# Patient Record
Sex: Male | Born: 1995 | Race: White | Hispanic: No | Marital: Single | Smoking: Never smoker
Health system: Southern US, Community
[De-identification: ages and names within clinical notes are randomized; demographics above are authoritative.]

---
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
## 2016-01-26 ENCOUNTER — Ambulatory Visit (INDEPENDENT_AMBULATORY_CARE_PROVIDER_SITE_OTHER): Payer: Managed Care, Other (non HMO) | Admitting: Pediatrics

## 2016-01-26 ENCOUNTER — Encounter: Payer: Self-pay | Admitting: Pediatrics

## 2016-01-26 VITALS — BP 118/62 | Ht 70.5 in | Wt 158.2 lb

## 2016-01-26 DIAGNOSIS — F341 Dysthymic disorder: Secondary | ICD-10-CM | POA: Insufficient documentation

## 2016-01-26 DIAGNOSIS — F902 Attention-deficit hyperactivity disorder, combined type: Secondary | ICD-10-CM | POA: Insufficient documentation

## 2016-01-26 DIAGNOSIS — F4320 Adjustment disorder, unspecified: Secondary | ICD-10-CM | POA: Diagnosis not present

## 2016-01-26 DIAGNOSIS — F900 Attention-deficit hyperactivity disorder, predominantly inattentive type: Secondary | ICD-10-CM | POA: Insufficient documentation

## 2016-01-26 MED ORDER — METHYLPHENIDATE HCL ER (OSM) 27 MG PO TBCR: 27.0000 mg | EXTENDED_RELEASE_TABLET | Freq: Every morning | ORAL | 0 refills | Status: AC

## 2016-01-26 NOTE — Progress Notes (Addendum)
[place] [facility] [place] [facility] [address], [place] [place] [address] Dept: [phone] Dept Fax: [phone] Loc: [phone] Loc Fax: [phone]  Medical Follow-up  Johnny Brady ID: Johnny Brady, male  DOB: 08-01-1995, 20 y.o.  MRN: [id number]  Date of Evaluation: 01/26/2016  PCP: Cam Hai, CNM  Accompanied by: Mother Johnny Brady Lives with: Parents, 75 year old brother, and 45 year old brother.  HISTORY/CURRENT STATUS:  HPI Medical follow-up for a 20 year old Johnny Brady who was previously evaluated and treated at [facility] for ADHD and an adolescent adjustment reaction. Johnny Brady was last seen on 08/11/2013 at this Center.  EDUCATION: School: Out of school    Activities/Exercise: Johnny Brady does not get much exercise except for occasional walking outside or playing with his 67 year old dog. Johnny Brady is looking for a job through [employer] and, according to his mother, Johnny Brady plays a lot of video games. Johnny Brady likes to draw and reported that Johnny Brady has been doing this on a fairly regular basis. Johnny Brady also taught himself to play the piano and occasionally does this.  MEDICAL HISTORY: Appetite: Good MVI/Other: No Fruits/Vegs: Daily Calcium: Daily Iron: Likes meats, not eggs.  Sleep: Bedtime: 12 midnight or later. Can stay up until 4 or 5 AM if Johnny Brady is doing something. Awakens: 10 AM no matter when Johnny Brady goes to bed. Sleep Concerns: Initiation/Maintenance/Other: None  Individual Medical History/Review of System Changes? No. Johnny Brady has been healthy without significant complaints.  Allergies: Review of Johnny Brady's allergies indicates no known allergies.  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 Johnny Brady has not seen the prescribing physician since Johnny Brady came  home from college in May 2017.  Medication Side Effects: None reported  Family Medical/Social History Changes?: Yes. His older brother who is now 44 years old still lives at home but is working part-time at [employer] and has a girlfriend. This brother has been diagnosed with ADHD in the past but is not being treated at the present time. There also is a 19 year old younger brother who is a Holiday representative in high school and has a diagnosis of ADHD and an autistic spectrum disorder. Johnny Brady has an IEP and is in mainstream classes with inclusion. Johnny Brady also has been monitored by a child psychiatrist and is being treated with Concerta, Abilify, Remeron, and a small dose of short acting methylphenidate. Johnny Brady's 18 year old sister was married in August 2017 and is now in graduate school at [employer] in [place], [place]. Since she has moved out of the house, Johnny Brady now has his own room and no longer has to share a room with his younger brother. Johnny Brady's father is employed full time but does not travel any longer, so Johnny Brady is home on a regular basis. Johnny Brady and the Johnny Brady "butt heads" a lot. Johnny Brady also reports that both of his brothers "get on my nerves a lot". Johnny Brady's mother is a full-time homemaker, and she reported that she is being treated with antidepressant medication and this has helped her a lot with her overall mental health. Johnny Brady's paternal grandparents are both being treated with SSRI's, and his maternal grandfather died of renal cancer last year. Johnny Brady's mother apparently was his primary caretaker, and the maternal grandmother also still lives in [place] area.  MENTAL HEALTH: Mental Health Issues: Johnny Brady was diagnosed with depression last school year and is  being treated with sertraline 100 mg every morning a.m. Johnny Brady has a male friend from high school, and Johnny Brady apparently signed a  lease on an apartment in [place] with her but only stayed for 2 days. Johnny Brady reported that there was never any romantic  relationship with her, and Johnny Brady, in fact, does not like her very much anymore. Johnny Brady also has some debt in college, and his mother reported that this could be a problem if Johnny Brady does not return to school next semester. Johnny Brady is on academic probation and is considering transfer to [employer] although Johnny Brady does not want to start back in college until the fall of 2018.  PHYSICAL EXAM: Vitals:  Today's Vitals   01/26/16 1231  BP: 118/62  Weight: 158 lb 3.2 oz (71.8 kg)  Height: 5' 10.5" (1.791 m)  , 48 %ile (Z= -0.05) based on CDC 2-20 Years BMI-for-age data using vitals from 01/26/2016. Body mass index is 22.38 kg/m.  General Exam: Physical Exam  Constitutional: Johnny Brady appears well-developed and well-nourished.  HENT:  Head: Normocephalic.  Right Ear: External ear normal.  Left Ear: External ear normal.  Nose: Nose normal.  Mouth/Throat: Oropharynx is clear and moist.  The left tympanic membrane could not be visualized because of cerumen occluding the external auditory canal. There was also some cerumen present in the right external auditory canal, but the right tympanic membrane could be visualized and looked normal.  Eyes: EOM are normal. Pupils are equal, round, and reactive to light.  Neck: Normal range of motion. Neck supple. No thyromegaly present.  Cardiovascular: Normal rate, regular rhythm and normal heart sounds.   Pulmonary/Chest: Effort normal and breath sounds normal.  Abdominal: Soft. There is no tenderness.  No masses or hepatosplenomegaly appreciated.  Genitourinary:  Genitourinary Comments: Deferred  Musculoskeletal: Normal range of motion.  Lymphadenopathy:    Johnny Brady has no cervical adenopathy.   Neurological exam: Oriented to person, place, time and situation.  Cranial Nerves: ll-XII intact including normal vision (by report), ability to move eyes in all directions and close eyes, a symmetrical smile, normal hearing (by report), and ability to swallow, elevate shoulders, and  protrude and lateralize tongue.  Neuromuscular:  Motor Mass: normal     Tone: normal     Strength: normal  DTR's: 3+ and symmetrical for both upper and lower extremities, no ankle clonus noted, and plantar responses flexor bilaterally.  Cerebellar: Normal gait. No ataxia, nystagmus, or tremor noted. Finger-to-finger and finger-to-nose maneuvers done appropriately without overflow movements(synkinesis), rapid alternating movements done well.  Sensory: Fine touch grossly intact without tactile defensiveness.  Gross motor skills: Able to walk on heels and toes, perform a tandem gait both forward and reversed, jump, hop on each foot alone without difficulty.  Testing/Developmental Screens: ASRS: Part A-21, Part B- 14  DIAGNOSES:    ICD-9-CM ICD-10-CM   1. ADHD (attention deficit hyperactivity disorder), combined type 314.01 F90.2 methylphenidate (CONCERTA) 27 MG PO CR tablet  2. Dysthymia 300.4 F34.1 sertraline (ZOLOFT) 100 MG tablet  3. Adjustment reaction of adolescence 309.9 F43.20     RECOMMENDATIONS:  I told Johnny Brady that I will continue to prescribe sertraline 100 mg every morning since Johnny Brady is doing well on this and does not have any significant side effects, as long as Johnny Brady is seeing a counselor on a regular basis. I gave him the alternative of being referred to a psychiatrist, but Johnny Brady elected to continue to come to [facility].  Even though Johnny Brady is not in school to present  time, I am restarting Concerta 27 mg every morning since Johnny Brady is not sure this is effective while his mother thinks it helps him a lot with focus. Also, Johnny Brady is interested in getting a driver's license, and I want to make sure that Johnny Brady is being treated if Johnny Brady is driving.  I plan on seeing Johnny Brady in about one month to determine if Johnny Brady is seeing a counselor, and what his his response to Concerta been. Also, I would like to see if Johnny Brady is better organized and has more motivation on medication. His plan is to continue to look for a job with  the hope of returning to college in the fall of 2018. Johnny Brady states that Johnny Brady ultimately would like to be a psychologist and do counseling.  Johnny Brady Instructions  Restart Concerta 27 mg every morning for after breakfast.  Continue sertraline 100 mg every morning as previously prescribed.  Make an appointment with the psychologist for counseling. Resources include [facility] (Dr. Bryson DamesSteven Altabet or Dr. Caralyn Guileavid Gutterman: [phone]), [facility], [facility], [facility], [facility] or [facility]. Call me if you need more references  Follow-up with primary care physician for health maintenance according to her schedule.   NEXT APPOINTMENT: Return in about 4 weeks (around 02/23/2016).   Greater than 50 percent of the time spent in counseling, discussing diagnosis and management of symptoms with Johnny Brady and family.   Roda Shuttershomas H. Quina Wilbourne, MD Counseling Time: 90 minutes      Total Contact Time: 120 minutes

## 2016-01-26 NOTE — Patient Instructions (Addendum)
Restart Concerta 27 mg every morning for after breakfast.  Continue sertraline 100 mg every morning as previously prescribed.  Make an appointment with the psychologist for counseling. Resources include [facility] (Dr. Bryson DamesSteven Altabet or Dr. Caralyn Guileavid Gutterman: [phone]), [facility], [facility], [facility], [facility] or [facility]. Call me if you need more references  Follow-up with primary care physician for health maintenance according to her schedule.

## 2016-02-14 ENCOUNTER — Encounter: Payer: Self-pay | Admitting: Pediatrics

## 2016-02-14 ENCOUNTER — Ambulatory Visit (INDEPENDENT_AMBULATORY_CARE_PROVIDER_SITE_OTHER): Payer: Managed Care, Other (non HMO) | Admitting: Pediatrics

## 2016-02-14 VITALS — Wt 158.8 lb

## 2016-02-14 DIAGNOSIS — F4320 Adjustment disorder, unspecified: Secondary | ICD-10-CM | POA: Diagnosis not present

## 2016-02-14 DIAGNOSIS — F902 Attention-deficit hyperactivity disorder, combined type: Secondary | ICD-10-CM

## 2016-02-14 DIAGNOSIS — F341 Dysthymic disorder: Secondary | ICD-10-CM | POA: Diagnosis not present

## 2016-02-14 NOTE — Patient Instructions (Signed)
Keep report with Dr. Sheppard CoilAltebet on 02/20/2016  Continue sertraline 100 mg every a.m. If you need to have a refill sent to pharmacy, I will do this.  Please make a plan of action for the next several months. I would recommend that he start driving on a regular basis, that you start reading or doing something that would enhance your performance in college, and video games probably won't do this. Please send me a copy of your plan once it is finished. Can mail, fax it, scant and send by email, or drop it off. We can then determine whether we need to continue Concerta or not at that time.  I recommend that you call the manager at [employer] and hopefully will have an interview for a job there.  When you have completed your plan and have seen Dr. Sheppard CoilAltebet, please contact me so that we can determine whether we should continue and/or increase the Concerta. Is the 27 mg has not been particularly effective and there are no significant side effects reported, I would recommend that we increase the dose to 36 mg every morning if we continue this.  I recommend that you make an appointment with your primary care doctor since it has been over a year and used to do have some chronic complaints that need to be addressed. It also is important to determine if you need any immunizations to stay up-to-date. I would recommend that you get a flu shot but that is up to you and your primary care doctor.  If you want to continue having your medications prescribed through this Center, we need to see you at least every 3 months.

## 2016-02-14 NOTE — Progress Notes (Addendum)
[place]  [facility] [place] [facility] [address] [place]. [address] [place] [address] Dept: [phone] Dept Fax: [phone] Loc: [phone] Loc Fax: [phone]  Medication Check  Patient ID: Johnny Brady, male  DOB: 1996/03/03, 20 y.o.  MRN: [id number]  Date of Evaluation: 02/14/2016  PCP: Cam Hai, CNM  Accompanied by: Mother Patient Lives with: Parents and 2 brothers, ages 96 years and 17 years respectively.  HISTORY/CURRENT STATUS: HPI medication check to determine patient's response to Concerta 27 mg and to determine what plans he has made regarding his future.  EDUCATION: School: He has completed 1 semester at [employer], but is not in school now.  Performance/ Grades: Patient is on academic probation because he got all F's during the spring semester, because he was sick with a strep throat and rash by his report. His plan is to enroll at [employer] for the fall semester 2018. At the present time, he reports that he is looking for a job.  Activities/ Exercise: According to patient and mother, he spends somewhere between 8 and 10 hours daily either playing video games or watching television and/or movies. He has a friend who works at a [employer], and he reported that she has requested that Engineer, site of the [employer] set up an interview, but he has been waiting for almost a week and has not received a call.  MEDICAL HISTORY: Appetite: Good  MVI/Other: None    Sleep: No change from last visit about 3 weeks ago.  Individual Medical History/ Review of Systems: Changes? No  Allergies: Review of patient's allergies indicates no known allergies.  Current Medications:  Current Outpatient Prescriptions:  .  methylphenidate (CONCERTA) 27 MG PO CR tablet, Take 1 tablet (27 mg total) by mouth every morning., Disp: 30 tablet, Rfl: 0 .  sertraline  (ZOLOFT) 100 MG tablet, , Disp: , Rfl:  Medication Side Effects: Patient denies any side effects since starting on Concerta 27 mg every morning. Specifically, he has not experienced headaches, abdominal pain, appetite suppression, sleep disturbance, rebound irritability, or tics. He also reports no significant side effects to sertraline, which was prescribed by a physician at [facility] last year.  Family Medical/ Social History: Changes? No  MENTAL HEALTH: Mental Health Issues: No significant changes. Patient reports that his mood continues to be much improved as starting on sertraline.  PHYSICAL EXAM; Vitals: Weight 158 lb 12.8 oz (72 kg).  General Physical Exam: Not done since this is a medication check and not a 3 month follow-up evaluation. Unchanged from previous exam: No obvious changes from the previous visit on 01/26/2016.  Testing/Developmental Screens: CGI:15th. Patient also completed the ASRS Symptom Checklist day and when he was last seen on 01/26/2016, and there are no significant changes in the scores on this instrument.  DIAGNOSES:    ICD-9-CM ICD-10-CM   1. Dysthymia 300.4 F34.1   2. ADHD (attention deficit hyperactivity disorder), combined type 314.01 F90.2   3. 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 Sheppard Coil, PhD psychologist on 02/20/2016. It is recommended that he keep this appointment and hopefully start with counseling.  Continue sertraline 100 mg every morning. He is still taking medication that was prescribed in the psychiatrist that he saw at [facility] during the spring of 2017, so I asked him to let me know when he needs a refill or have the pharmacy  notify me.  I asked patient to make an action plan, put it in writing, and send a copy to me. I recommended that he call the manager of the restaurant to inquire about an interview for a job, that he look into enrolling at [employer] for the winter  semester in order to get some of his prerequisites done and look into application of programs at [employer], that he start driving on a regular basis(he has a driving permit but does not yet have a license), and start doing something besides playing video games and watching TV/movies. I recommended that he might want to consider reading or doing some other activities that might help him prepare for returning to college. Also, hopefully, he will find a job in the not too distant future.  Once Sheliah HatchCamden has created an action plan and seen Dr. Sheppard CoilAltebet, I recommended that he contact me regarding whether or not we need to continue the Concerta. If he does get a plan together and we decide that Concerta might be helpful, I will probably need to increase the dose to 36 mg every morning because neither he nor his mother have noticed much response to 27 mg every morning.  I will send an email to Dr. Sheppard CoilAltebet asking him to let me know Torrance's disposition after he sees him next week.  When Worthingtonamden first came into the exam room, he complained of having sinus pain back soreness several days ago although these symptoms have resolved at the present time. I recommended that he see his primary care physician since he has not been seen in more than a year. And then discuss symptoms that he is having and see if he needs to update immunizations are other health maintenance items.  NEXT APPOINTMENT: I told Elena his mother that he will need to be seen at least every 3 months if we continue to prescribed medication for him at this Center. Since I may need to see him sooner depending on what we decide to do with Concerta, they elected not to schedule a follow-up appointment at this time.  Greater than 50 percent of the time spent in counseling, discussing diagnosis and management of symptoms with patient and his mother.  Roda Shuttershomas H. Colene Mines, MD Counseling Time: 35 minutes Total Contact Time: 40 minutes

## 2016-02-20 ENCOUNTER — Ambulatory Visit (INDEPENDENT_AMBULATORY_CARE_PROVIDER_SITE_OTHER): Payer: Managed Care, Other (non HMO) | Admitting: Psychology

## 2016-02-20 DIAGNOSIS — F902 Attention-deficit hyperactivity disorder, combined type: Secondary | ICD-10-CM | POA: Diagnosis not present

## 2016-02-20 DIAGNOSIS — F341 Dysthymic disorder: Secondary | ICD-10-CM | POA: Diagnosis not present

## 2016-03-12 ENCOUNTER — Ambulatory Visit (INDEPENDENT_AMBULATORY_CARE_PROVIDER_SITE_OTHER): Payer: Managed Care, Other (non HMO) | Admitting: Psychology

## 2016-03-12 DIAGNOSIS — F341 Dysthymic disorder: Secondary | ICD-10-CM | POA: Diagnosis not present

## 2016-03-12 DIAGNOSIS — F902 Attention-deficit hyperactivity disorder, combined type: Secondary | ICD-10-CM

## 2016-04-10 ENCOUNTER — Ambulatory Visit (INDEPENDENT_AMBULATORY_CARE_PROVIDER_SITE_OTHER): Payer: Managed Care, Other (non HMO) | Admitting: Psychology

## 2016-04-10 DIAGNOSIS — F341 Dysthymic disorder: Secondary | ICD-10-CM | POA: Diagnosis not present

## 2016-04-10 DIAGNOSIS — F900 Attention-deficit hyperactivity disorder, predominantly inattentive type: Secondary | ICD-10-CM

## 2016-05-14 ENCOUNTER — Ambulatory Visit (INDEPENDENT_AMBULATORY_CARE_PROVIDER_SITE_OTHER): Payer: Managed Care, Other (non HMO) | Admitting: Psychology

## 2016-05-14 DIAGNOSIS — F900 Attention-deficit hyperactivity disorder, predominantly inattentive type: Secondary | ICD-10-CM | POA: Diagnosis not present

## 2016-05-14 DIAGNOSIS — F341 Dysthymic disorder: Secondary | ICD-10-CM

## 2016-06-14 ENCOUNTER — Ambulatory Visit (INDEPENDENT_AMBULATORY_CARE_PROVIDER_SITE_OTHER): Payer: Managed Care, Other (non HMO) | Admitting: Psychology

## 2016-06-14 DIAGNOSIS — F341 Dysthymic disorder: Secondary | ICD-10-CM | POA: Diagnosis not present

## 2016-06-14 DIAGNOSIS — F900 Attention-deficit hyperactivity disorder, predominantly inattentive type: Secondary | ICD-10-CM

## 2016-07-19 ENCOUNTER — Ambulatory Visit (INDEPENDENT_AMBULATORY_CARE_PROVIDER_SITE_OTHER): Payer: Managed Care, Other (non HMO) | Admitting: Psychology

## 2016-07-19 DIAGNOSIS — F900 Attention-deficit hyperactivity disorder, predominantly inattentive type: Secondary | ICD-10-CM

## 2016-07-19 DIAGNOSIS — F4322 Adjustment disorder with anxiety: Secondary | ICD-10-CM

## 2016-07-20 ENCOUNTER — Ambulatory Visit: Payer: Managed Care, Other (non HMO) | Admitting: Psychology

## 2016-08-16 ENCOUNTER — Ambulatory Visit (INDEPENDENT_AMBULATORY_CARE_PROVIDER_SITE_OTHER): Payer: 59 | Admitting: Psychology

## 2016-08-16 DIAGNOSIS — F900 Attention-deficit hyperactivity disorder, predominantly inattentive type: Secondary | ICD-10-CM

## 2016-08-16 DIAGNOSIS — F4322 Adjustment disorder with anxiety: Secondary | ICD-10-CM

## 2016-10-02 ENCOUNTER — Ambulatory Visit (INDEPENDENT_AMBULATORY_CARE_PROVIDER_SITE_OTHER): Payer: 59 | Admitting: Psychology

## 2016-10-02 DIAGNOSIS — F902 Attention-deficit hyperactivity disorder, combined type: Secondary | ICD-10-CM | POA: Diagnosis not present

## 2016-10-02 DIAGNOSIS — F411 Generalized anxiety disorder: Secondary | ICD-10-CM | POA: Diagnosis not present

## 2016-10-31 ENCOUNTER — Ambulatory Visit (INDEPENDENT_AMBULATORY_CARE_PROVIDER_SITE_OTHER): Payer: 59 | Admitting: Psychology

## 2016-10-31 DIAGNOSIS — F411 Generalized anxiety disorder: Secondary | ICD-10-CM

## 2016-10-31 DIAGNOSIS — F900 Attention-deficit hyperactivity disorder, predominantly inattentive type: Secondary | ICD-10-CM

## 2016-12-05 ENCOUNTER — Ambulatory Visit (INDEPENDENT_AMBULATORY_CARE_PROVIDER_SITE_OTHER): Payer: BLUE CROSS/BLUE SHIELD | Admitting: Psychology

## 2016-12-05 DIAGNOSIS — F900 Attention-deficit hyperactivity disorder, predominantly inattentive type: Secondary | ICD-10-CM | POA: Diagnosis not present

## 2016-12-05 DIAGNOSIS — F411 Generalized anxiety disorder: Secondary | ICD-10-CM

## 2017-01-24 ENCOUNTER — Ambulatory Visit: Payer: BLUE CROSS/BLUE SHIELD | Admitting: Psychology

## 2017-02-14 ENCOUNTER — Ambulatory Visit (INDEPENDENT_AMBULATORY_CARE_PROVIDER_SITE_OTHER): Payer: BLUE CROSS/BLUE SHIELD | Admitting: Psychology

## 2017-02-14 DIAGNOSIS — F900 Attention-deficit hyperactivity disorder, predominantly inattentive type: Secondary | ICD-10-CM

## 2017-02-14 DIAGNOSIS — F411 Generalized anxiety disorder: Secondary | ICD-10-CM

## 2017-03-20 ENCOUNTER — Ambulatory Visit: Payer: BLUE CROSS/BLUE SHIELD | Admitting: Psychology

## 2017-11-06 DIAGNOSIS — Z79899 Other long term (current) drug therapy: Secondary | ICD-10-CM | POA: Diagnosis not present

## 2017-11-06 DIAGNOSIS — Z23 Encounter for immunization: Secondary | ICD-10-CM | POA: Diagnosis not present

## 2017-11-06 DIAGNOSIS — Z0000 Encounter for general adult medical examination without abnormal findings: Secondary | ICD-10-CM | POA: Diagnosis not present

## 2017-12-09 DIAGNOSIS — R8290 Unspecified abnormal findings in urine: Secondary | ICD-10-CM | POA: Diagnosis not present

## 2017-12-19 DIAGNOSIS — R319 Hematuria, unspecified: Secondary | ICD-10-CM | POA: Diagnosis not present

## 2018-01-24 DIAGNOSIS — R311 Benign essential microscopic hematuria: Secondary | ICD-10-CM | POA: Diagnosis not present

## 2018-02-10 DIAGNOSIS — R3129 Other microscopic hematuria: Secondary | ICD-10-CM | POA: Diagnosis not present

## 2018-02-10 DIAGNOSIS — R311 Benign essential microscopic hematuria: Secondary | ICD-10-CM | POA: Diagnosis not present

## 2018-02-20 DIAGNOSIS — Z23 Encounter for immunization: Secondary | ICD-10-CM | POA: Diagnosis not present

## 2018-02-21 DIAGNOSIS — R311 Benign essential microscopic hematuria: Secondary | ICD-10-CM | POA: Diagnosis not present

## 2018-02-21 DIAGNOSIS — D3500 Benign neoplasm of unspecified adrenal gland: Secondary | ICD-10-CM | POA: Diagnosis not present

## 2018-03-07 ENCOUNTER — Other Ambulatory Visit: Payer: Self-pay | Admitting: Urology

## 2018-03-07 DIAGNOSIS — E278 Other specified disorders of adrenal gland: Secondary | ICD-10-CM

## 2018-03-20 ENCOUNTER — Ambulatory Visit
Admission: RE | Admit: 2018-03-20 | Discharge: 2018-03-20 | Disposition: A | Discharge status: Home / Self Care | Payer: BLUE CROSS/BLUE SHIELD | Source: Ambulatory Visit | Attending: Urology | Admitting: Urology

## 2018-03-20 DIAGNOSIS — E278 Other specified disorders of adrenal gland: Secondary | ICD-10-CM

## 2018-03-20 DIAGNOSIS — E279 Disorder of adrenal gland, unspecified: Secondary | ICD-10-CM | POA: Diagnosis not present

## 2018-03-20 MED ORDER — GADOBENATE DIMEGLUMINE 529 MG/ML IV SOLN
16.0000 mL | Freq: Once | INTRAVENOUS | Status: AC | PRN
  Administered 2018-03-20: 16 mL via INTRAVENOUS

## 2018-05-09 DIAGNOSIS — R311 Benign essential microscopic hematuria: Secondary | ICD-10-CM | POA: Diagnosis not present

## 2018-05-09 DIAGNOSIS — D3500 Benign neoplasm of unspecified adrenal gland: Secondary | ICD-10-CM | POA: Diagnosis not present

## 2018-05-20 DIAGNOSIS — D3500 Benign neoplasm of unspecified adrenal gland: Secondary | ICD-10-CM | POA: Diagnosis not present

## 2018-06-06 DIAGNOSIS — D3500 Benign neoplasm of unspecified adrenal gland: Secondary | ICD-10-CM | POA: Diagnosis not present

## 2018-10-28 DIAGNOSIS — D3500 Benign neoplasm of unspecified adrenal gland: Secondary | ICD-10-CM | POA: Diagnosis not present

## 2018-12-24 IMAGING — MR MR ABDOMEN WO/W CM
12 of 17 series · 26 of 48 positions shown · IV contrast (16ml multihance)
Comparison: CT on 02/10/2018

CLINICAL DATA: Indeterminate left adrenal mass on recent CT.

EXAM:
MRI ABDOMEN WITHOUT AND WITH CONTRAST
TECHNIQUE: Multiplanar multisequence MR imaging of the abdomen was performed
both before and after the administration of intravenous contrast.
CONTRAST:  16mL MULTIHANCE GADOBENATE DIMEGLUMINE 529 MG/ML IV SOLN

[Series 3: cor haste · coronal · 5.0mm · 0.74mm/px · 1 of 31 slices shown]
[im 1/31]
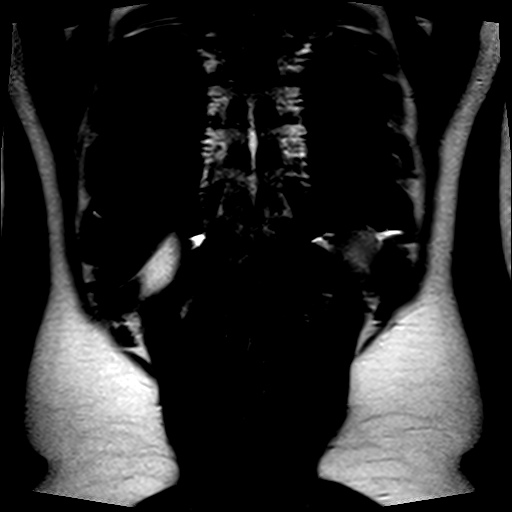

[Series 4: T1 · axial · 6.0mm · 0.68mm/px · 1 of 56 slices shown (1 of 2)]
[im 1/56]
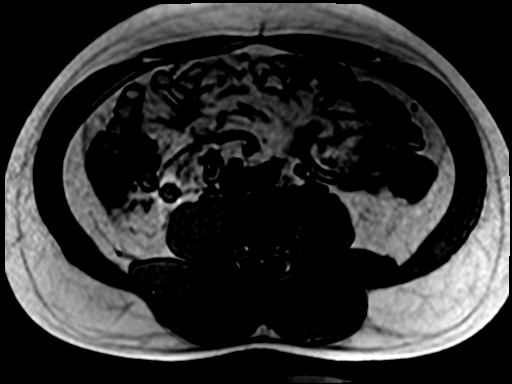

[Series 5: T1 · coronal · 4.0mm · 0.78mm/px · 2 of 76 slices shown (2 of 2)]
[im 1/76]
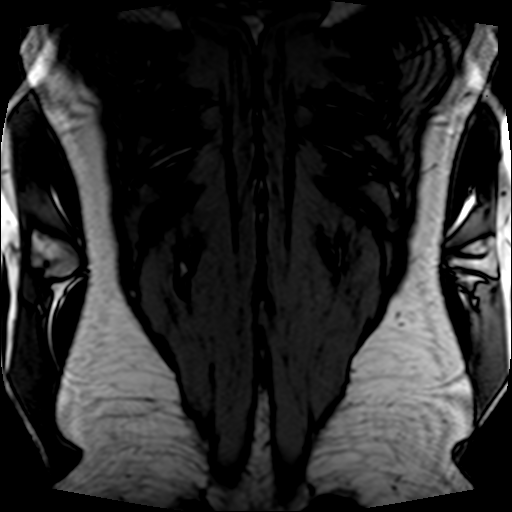
[im 76/76]
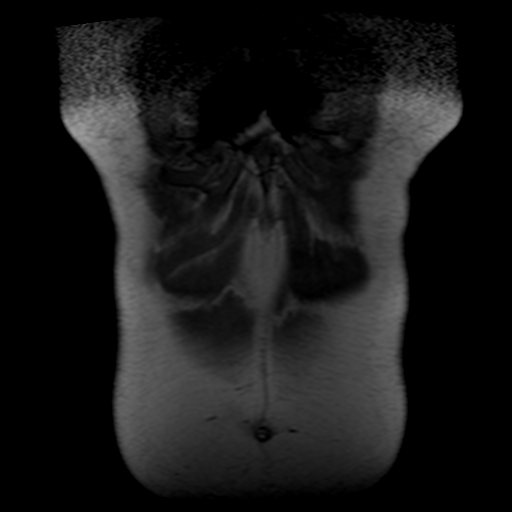

[Series 6: T2 · axial · 4.0mm · 0.49mm/px · 1 of 44 slices shown]
[im 1/44]
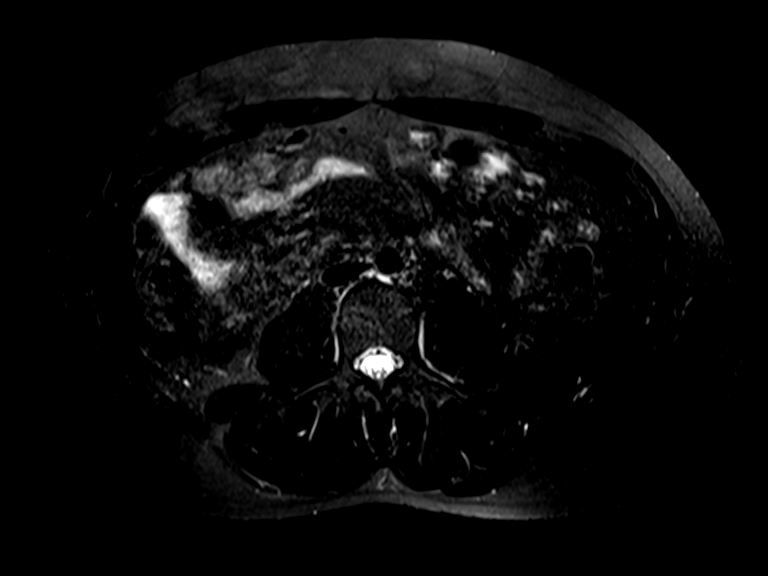

[Series 7: ep2d_[id number]_500_800_p2_trig · axial · 6.0mm · 1.98mm/px · z∈[-43,+184]mm · 2 of 90 slices shown]
[im 1/90]
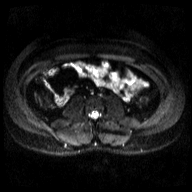
[im 90/90]
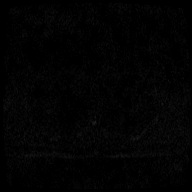

[Series 8: ep2d_[id number]_800_p2_trig_adc · axial · 6.0mm · 1.98mm/px · 1 of 30 slices shown]
[im 1/30]
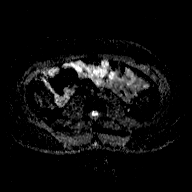

[Series 9: axial haste · axial · 5.0mm · 0.74mm/px · 1 of 34 slices shown]
[im 1/34]
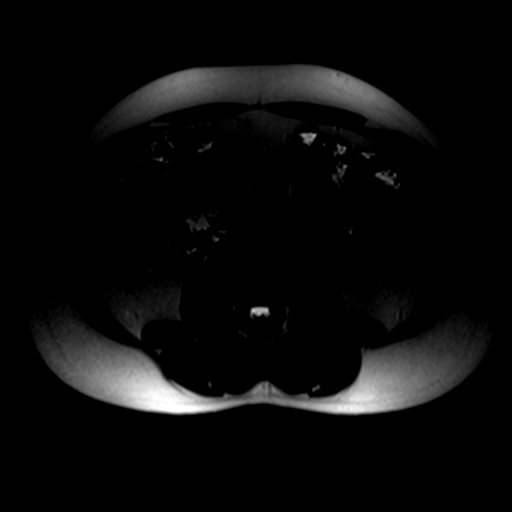

[Series 10: T1 dynamic · axial · non-contrast · 2.0mm · 0.78mm/px · z∈[-45,+145]mm · 4 of 96 slices shown]
[im 1/96]
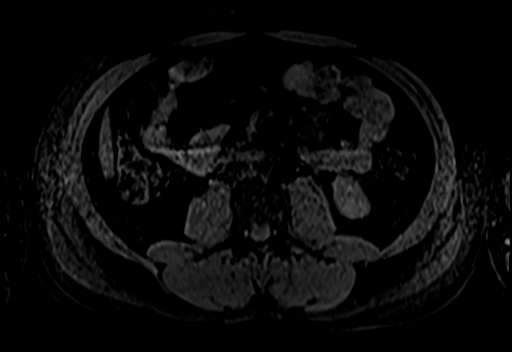
[im 32/96]
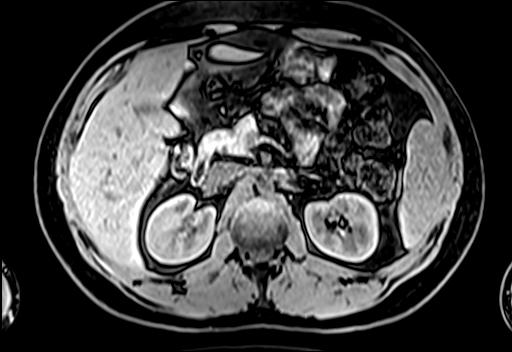
[im 64/96]
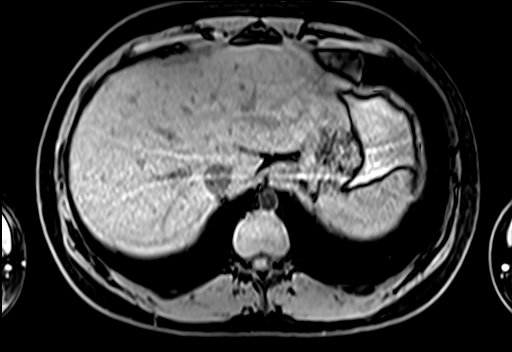
[im 96/96]
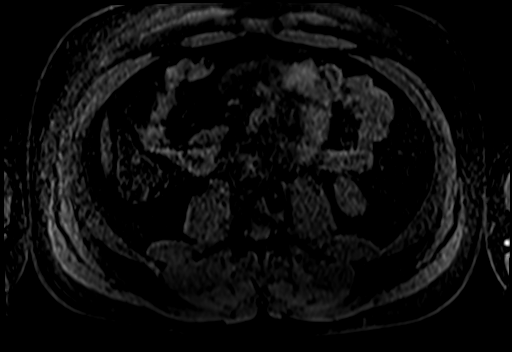

[Series 11: T1 dynamic post-contrast · axial · 2.0mm · 0.78mm/px · z∈[-45,+145]mm · 4 of 96 slices shown (1 of 4)]
[im 1/96]
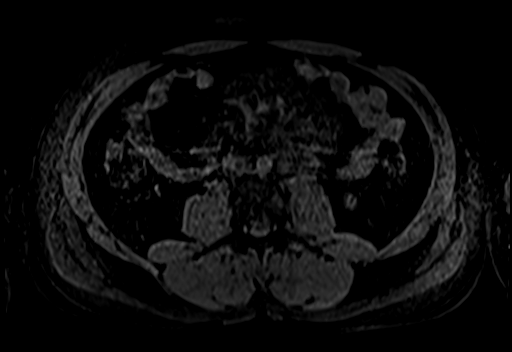
[im 32/96]
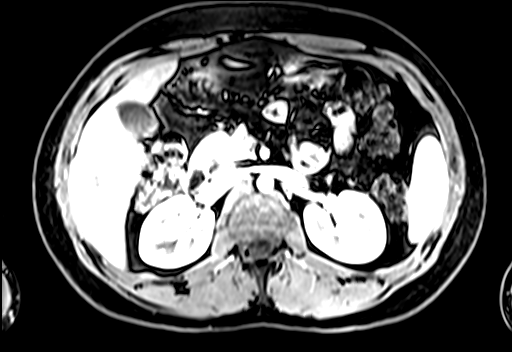
[im 64/96]
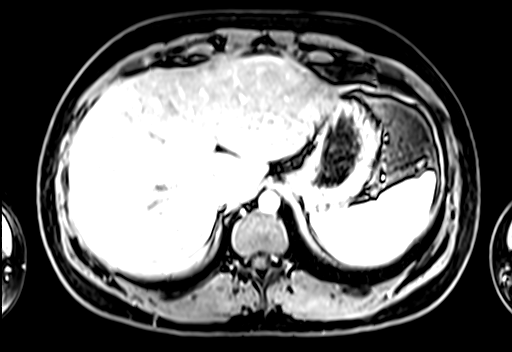
[im 96/96]
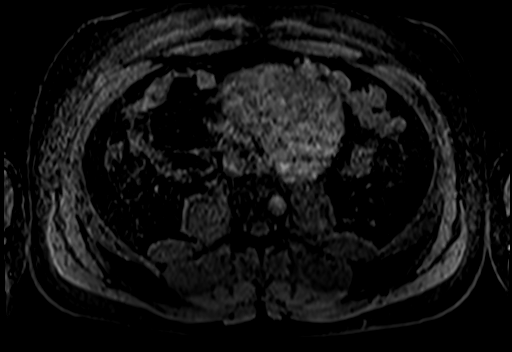

[Series 12: T1 dynamic post-contrast · axial · 2.0mm · 0.78mm/px · z∈[-45,+145]mm · 4 of 96 slices shown (2 of 4)]
[im 1/96]
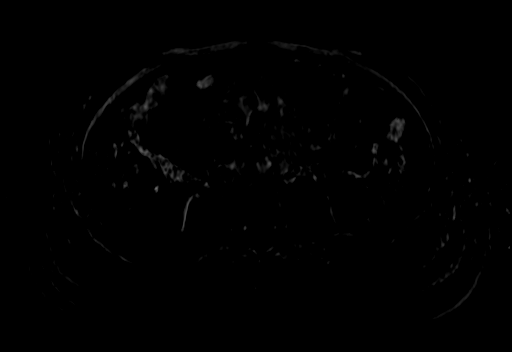
[im 32/96]
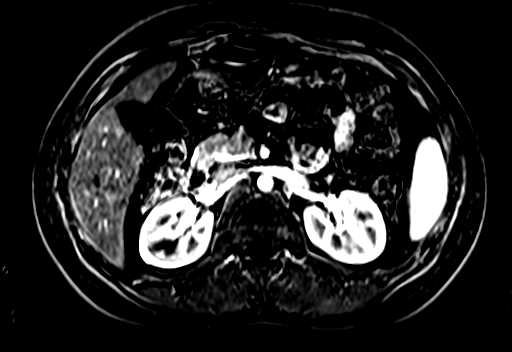
[im 64/96]
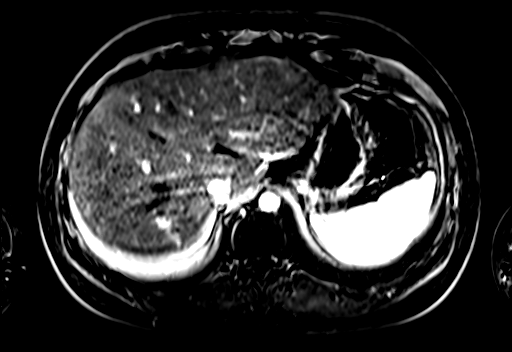
[im 96/96]
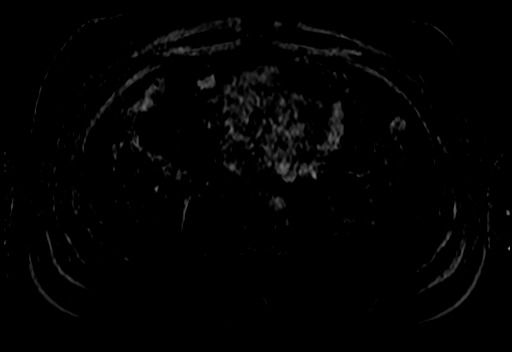

[Series 13: T1 dynamic post-contrast · axial · 2.0mm · 0.78mm/px · z∈[-45,+145]mm · 4 of 96 slices shown (3 of 4)]
[im 1/96]
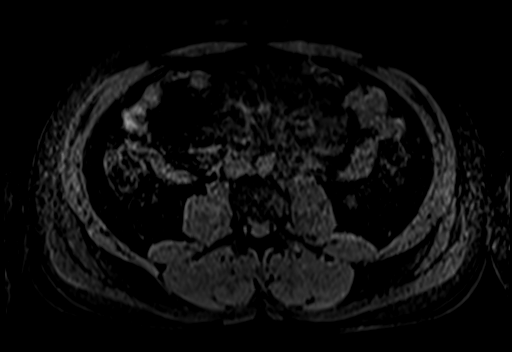
[im 32/96]
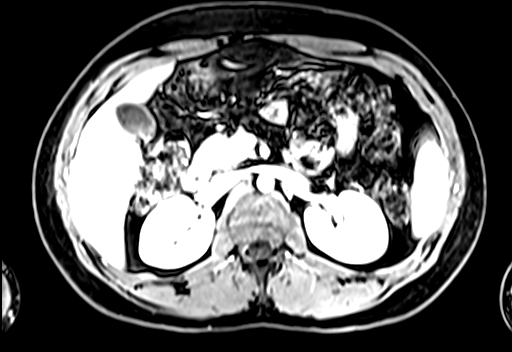
[im 64/96]
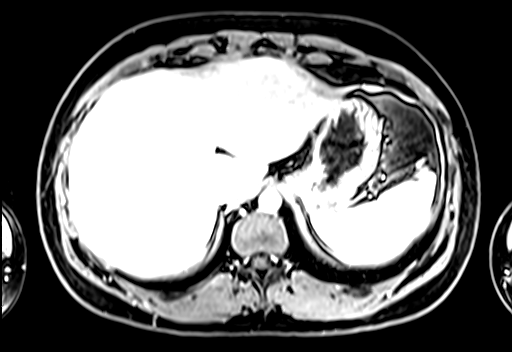
[im 96/96]
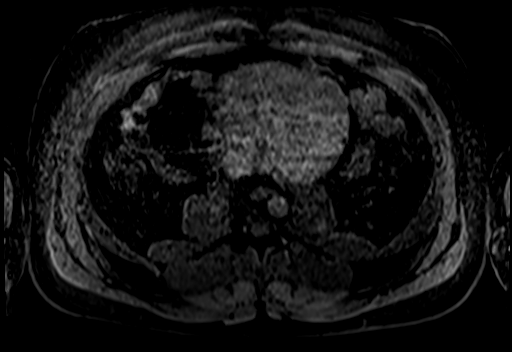

[Series 14: T1 dynamic post-contrast · axial · 2.0mm · 0.78mm/px · 1 of 96 slices shown (4 of 4)]
[im 1/96]
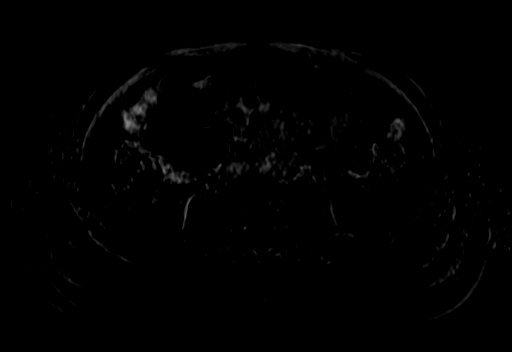

[26 of 48 positions shown; findings below may reference images not displayed]

FINDINGS: Lower chest: No acute findings.

Hepatobiliary: No hepatic masses identified. Mild diffuse hepatic
steatosis. Gallbladder is unremarkable. No evidence of biliary
ductal dilatation.

Pancreas:  No mass or inflammatory changes.

Spleen:  Within normal limits in size and appearance.

Adrenals/Urinary Tract: A 2.5 x 1.5 cm left adrenal mass is seen
which shows mild T2 hypointensity and homogeneous enhancement, but
lack of intra cytoplasmic fat on chemical shift imaging. This has
nonspecific characteristics, but likely represents a benign lipid
poor adenoma in a patient without history of cancer.

The right adrenal gland and both kidneys are normal in appearance.

Stomach/Bowel: Visualized portion unremarkable.

Vascular/Lymphatic: No pathologically enlarged lymph nodes
identified. No abdominal aortic aneurysm.

Other:  None.

Musculoskeletal:  No suspicious bone lesions identified.
IMPRESSION: 2.5 cm nonspecific left adrenal mass. This likely represents a lipid
poor adenoma in patient without history of cancer. Recommend
continued imaging follow-up with adrenal protocol abdomen CT without
and with contrast in 6 months. This recommendation follows ACR
consensus guidelines: Management of Incidental Adrenal Masses: A
White Paper of the ACR Incidental Findings Committee. [HOSPITAL] 1585;14:[id number].

Mild hepatic steatosis.

## 2020-03-24 ENCOUNTER — Encounter: Payer: Self-pay | Admitting: Internal Medicine

## 2020-05-26 ENCOUNTER — Ambulatory Visit: Payer: Self-pay | Admitting: Internal Medicine
